# Patient Record
Sex: Male | Born: 1995 | Race: White | Hispanic: No | Marital: Single | Smoking: Never smoker
Health system: Southern US, Community
[De-identification: ages and names within clinical notes are randomized; demographics above are authoritative.]

## PROBLEM LIST (undated history)

## (undated) HISTORY — PX: OTHER SURGICAL HISTORY: SHX169

---
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
## 2017-02-16 ENCOUNTER — Encounter (HOSPITAL_COMMUNITY): Payer: Self-pay

## 2017-02-16 ENCOUNTER — Emergency Department (HOSPITAL_COMMUNITY): Payer: Worker's Compensation

## 2017-02-16 ENCOUNTER — Emergency Department (HOSPITAL_COMMUNITY)
Admission: EM | Admit: 2017-02-16 | Discharge: 2017-02-16 | Disposition: A | Discharge status: Home / Self Care | Payer: Worker's Compensation | Attending: Emergency Medicine | Admitting: Emergency Medicine

## 2017-02-16 DIAGNOSIS — Y929 Unspecified place or not applicable: Secondary | ICD-10-CM | POA: Diagnosis not present

## 2017-02-16 DIAGNOSIS — W01198A Fall on same level from slipping, tripping and stumbling with subsequent striking against other object, initial encounter: Secondary | ICD-10-CM | POA: Insufficient documentation

## 2017-02-16 DIAGNOSIS — Y990 Civilian activity done for income or pay: Secondary | ICD-10-CM | POA: Diagnosis not present

## 2017-02-16 DIAGNOSIS — Y9301 Activity, walking, marching and hiking: Secondary | ICD-10-CM | POA: Insufficient documentation

## 2017-02-16 DIAGNOSIS — S43014A Anterior dislocation of right humerus, initial encounter: Secondary | ICD-10-CM | POA: Diagnosis not present

## 2017-02-16 DIAGNOSIS — S4991XA Unspecified injury of right shoulder and upper arm, initial encounter: Secondary | ICD-10-CM | POA: Diagnosis present

## 2017-02-16 MED ORDER — PROPOFOL 10 MG/ML IV BOLUS
100.0000 mg | Freq: Once | INTRAVENOUS | Status: DC
  Filled 2017-02-16: qty 20

## 2017-02-16 MED ORDER — HYDROMORPHONE HCL 1 MG/ML IJ SOLN
1.0000 mg | Freq: Once | INTRAMUSCULAR | Status: AC
  Administered 2017-02-16: 1 mg via INTRAVENOUS
  Filled 2017-02-16: qty 1

## 2017-02-16 MED ORDER — IBUPROFEN 600 MG PO TABS: 600.0000 mg | ORAL_TABLET | Freq: Four times a day (QID) | ORAL | 0 refills | Status: AC | PRN

## 2017-02-16 MED ORDER — PROPOFOL 10 MG/ML IV BOLUS
INTRAVENOUS | Status: AC | PRN
  Administered 2017-02-16: 20 mg via INTRAVENOUS
  Administered 2017-02-16: 30 mg via INTRAVENOUS
  Administered 2017-02-16 (×2): 20 mg via INTRAVENOUS
  Administered 2017-02-16: 40 mg via INTRAVENOUS

## 2017-02-16 NOTE — Progress Notes (Signed)
Orthopedic Tech Progress Note Patient Details:  Brandon Zuniga January 18, 1996 [id number]  Ortho Devices Type of Ortho Device: Sling immobilizer Ortho Device/Splint Location: rue Ortho Device/Splint Interventions: Ordered, Application, Adjustment   Trinna PostMartinez, Claudio Mondry J 02/16/2017, 11:24 PM

## 2017-02-16 NOTE — ED Triage Notes (Signed)
Pt reports she slipped and fell today at work. He reports right shoulder pain, possible dislocation. + sensation, + radial pulse

## 2017-02-16 NOTE — ED Provider Notes (Signed)
[facility]-EMERGENCY DEPT Provider Note   CSN: [id number] Arrival date & time: 02/16/17  1810     History   Chief Complaint Chief Complaint  Patient presents with  . Shoulder Pain    HPI Brandon Zuniga is a 21 y.o. male.  The history is provided by the patient.  Shoulder Pain   This is a new problem. The current episode started 1 to 2 hours ago. The problem occurs constantly. The problem has not changed since onset.The pain is present in the right shoulder. The quality of the pain is described as aching. The pain is severe. Associated symptoms include limited range of motion. Pertinent negatives include no numbness. He has tried nothing for the symptoms. There has been a history of trauma (Slipped and fell on well at work falling onto the posterior aspect of his right shoulder.).    History reviewed. No pertinent past medical history.  There are no active problems to display for this patient.   History reviewed. No pertinent surgical history.     Home Medications    Prior to Admission medications   Medication Sig Start Date End Date Taking? Authorizing Provider  ibuprofen (ADVIL,MOTRIN) 600 MG tablet Take 1 tablet (600 mg total) by mouth every 6 (six) hours as needed. 02/16/17   Nira Connardama, Brennden Masten Eduardo, MD    Family History No family history on file.  Social History Social History  Substance Use Topics  . Smoking status: Never Smoker  . Smokeless tobacco: Never Used  . Alcohol use No     Allergies   Patient has no allergy information on record.   Review of Systems Review of Systems  Neurological: Negative for numbness.   All other systems are reviewed and are negative for acute change except as noted in the HPI   Physical Exam Updated Vital Signs BP 126/67   Pulse (!) 59   Temp 98.9 F (37.2 C) (Oral)   Resp 19   Ht 6' (1.829 m)   Wt 81.6 kg (180 lb)   SpO2 100%   BMI 24.41 kg/m   Physical Exam  Constitutional: He is oriented to person, place,  and time. He appears well-developed and well-nourished. No distress.  HENT:  Head: Normocephalic.  Right Ear: External ear normal.  Left Ear: External ear normal.  Mouth/Throat: Oropharynx is clear and moist.  Eyes: Conjunctivae and EOM are normal. Pupils are equal, round, and reactive to light. Right eye exhibits no discharge. Left eye exhibits no discharge. No scleral icterus.  Neck: Normal range of motion. Neck supple.  Cardiovascular: Regular rhythm and normal heart sounds.  Exam reveals no gallop and no friction rub.   No murmur heard. Pulses:      Radial pulses are 2+ on the right side, and 2+ on the left side.       Dorsalis pedis pulses are 2+ on the right side, and 2+ on the left side.  Pulmonary/Chest: Effort normal and breath sounds normal. No stridor. No respiratory distress.  Abdominal: Soft. He exhibits no distension. There is no tenderness.  Musculoskeletal:       Right shoulder: He exhibits tenderness and deformity. He exhibits normal pulse and normal strength.       Cervical back: He exhibits no bony tenderness.       Thoracic back: He exhibits no bony tenderness.       Lumbar back: He exhibits no bony tenderness.  Clavicle stable. Chest stable to AP/Lat compression. Pelvis stable to Lat compression. No  obvious extremity deformity. No chest or abdominal wall contusion.  Neurological: He is alert and oriented to person, place, and time. GCS eye subscore is 4. GCS verbal subscore is 5. GCS motor subscore is 6.  Moving all extremities   Skin: Skin is warm. He is not diaphoretic.     ED Treatments / Results  Labs (all labs ordered are listed, but only abnormal results are displayed) Labs Reviewed - No data to display  EKG  EKG Interpretation None       Radiology Dg Shoulder Right  Result Date: 02/16/2017 CLINICAL DATA:  Slipped and fell, with right shoulder pain. History of multiple dislocations. Initial encounter. EXAM: RIGHT SHOULDER - 2+ VIEW COMPARISON:   None. FINDINGS: There is anterior-inferior dislocation of the right humeral head. No definite Hill-Sachs or osseous Bankart lesion is seen. The right acromioclavicular joint is grossly unremarkable in appearance. The visualized portions of the lungs are clear. IMPRESSION: Anterior-inferior dislocation of right humeral head. No definite Hill-Sachs or osseous Bankart lesion seen. Electronically Signed   By: Roanna Raider M.D.   On: 02/16/2017 19:11   Dg Shoulder Right Portable  Result Date: 02/16/2017 CLINICAL DATA:  Postreduction EXAM: PORTABLE RIGHT SHOULDER COMPARISON:  02/16/2017 FINDINGS: Anatomic position of the right shoulder is demonstrated postreduction. No acute fractures are demonstrated. Coracoclavicular and acromioclavicular spaces are maintained. No focal bone lesion or bone destruction. IMPRESSION: Anatomic position of the right shoulder postreduction. No acute fractures identified. Electronically Signed   By: Burman Nieves M.D.   On: 02/16/2017 22:50    Procedures ORTHOPEDIC INJURY TREATMENT Date/Time: 02/16/2017 9:30 PM Performed by: Nira Conn Authorized by: Nira Conn  Consent: Written consent obtained. Consent given by: patient Patient understanding: patient states understanding of the procedure being performed Patient consent: the patient's understanding of the procedure matches consent given Imaging studies: imaging studies available Patient identity confirmed: arm band Time out: Immediately prior to procedure a "time out" was called to verify the correct patient, procedure, equipment, support staff and site/side marked as required. Injury location: shoulder Location details: right shoulder Injury type: dislocation Dislocation type: anterior Hill-Sachs deformity: no Chronicity: new Pre-procedure neurovascular assessment: neurovascularly intact  Anesthesia: Local anesthesia used: no  Sedation: Patient sedated: yes Sedatives:  propofol Vitals: Vital signs were monitored during sedation. Manipulation performed: yes Reduction method: external rotation Reduction successful: yes X-ray confirmed reduction: yes Immobilization: sling Post-procedure neurovascular assessment: post-procedure neurovascularly intact Patient tolerance: Patient tolerated the procedure well with no immediate complications    (including critical care time)   Procedural sedation Performed by: Amadeo Garnet Kerem Gilmer Consent: Verbal consent obtained. Risks and benefits: risks, benefits and alternatives were discussed Required items: required blood products, implants, devices, and/or special equipment available Patient identity confirmed: arm band and provided demographic data Time out: Immediately prior to procedure a "time out" was called to verify the correct patient, procedure, equipment, support staff and site/side marked as required.  Sedation type: moderate (conscious) sedation NPO time confirmed and considedered  Sedatives: 130mg  propofol   Physician Time at Bedside: 15  Vitals: Vital signs were monitored during sedation. Cardiac Monitor, pulse oximeter Patient tolerance: Patient tolerated the procedure well with no immediate complications. Comments: Pt with uneventful recovered. Returned to pre-procedural sedation baseline   Medications Ordered in ED Medications  propofol (DIPRIVAN) 10 mg/mL bolus/IV push 100 mg (100 mg Intravenous Canceled Entry 02/16/17 2150)  HYDROmorphone (DILAUDID) injection 1 mg (1 mg Intravenous Given 02/16/17 2032)  propofol (DIPRIVAN) 10 mg/mL bolus/IV push (30 mg  Intravenous Given 02/16/17 2210)     Initial Impression / Assessment and Plan / ED Course  I have reviewed the triage vital signs and the nursing notes.  Pertinent labs & imaging results that were available during my care of the patient were reviewed by me and considered in my medical decision making (see chart for details).     Right  anterior shoulder dislocation without evidence of acute fracture. Successful reduction with conscious sedation that was confirmed by repeat imaging. Patient provided with a sling. Instructed to follow-up with a primary care provider and his orthopedic as needed.  Final Clinical Impressions(s) / ED Diagnoses   Final diagnoses:  Anterior dislocation of right shoulder, initial encounter   Disposition: Discharge  Condition: Good  I have discussed the results, Dx and Tx plan with the patient who expressed understanding and agree(s) with the plan. Discharge instructions discussed at great length. The patient was given strict return precautions who verbalized understanding of the instructions. No further questions at time of discharge.    Discharge Medication List as of 02/16/2017 11:08 PM    START taking these medications   Details  ibuprofen (ADVIL,MOTRIN) 600 MG tablet Take 1 tablet (600 mg total) by mouth every 6 (six) hours as needed., Starting Fri 02/16/2017, Print        Follow Up: Primary care provider  Schedule an appointment as soon as possible for a visit  As needed      Mirjana Tarleton, Amadeo Garnet, MD 02/16/17 2347

## 2017-02-16 NOTE — Sedation Documentation (Signed)
Pt using urinal at bedside.

## 2018-10-31 ENCOUNTER — Emergency Department (HOSPITAL_COMMUNITY)
Admission: EM | Admit: 2018-10-31 | Discharge: 2018-10-31 | Disposition: A | Discharge status: Home / Self Care | Payer: No Typology Code available for payment source | Attending: Emergency Medicine | Admitting: Emergency Medicine

## 2018-10-31 ENCOUNTER — Emergency Department (HOSPITAL_COMMUNITY): Payer: No Typology Code available for payment source

## 2018-10-31 ENCOUNTER — Other Ambulatory Visit: Payer: Self-pay

## 2018-10-31 ENCOUNTER — Encounter (HOSPITAL_COMMUNITY): Payer: Self-pay

## 2018-10-31 DIAGNOSIS — Y929 Unspecified place or not applicable: Secondary | ICD-10-CM | POA: Diagnosis not present

## 2018-10-31 DIAGNOSIS — S43015A Anterior dislocation of left humerus, initial encounter: Secondary | ICD-10-CM | POA: Insufficient documentation

## 2018-10-31 DIAGNOSIS — S4992XA Unspecified injury of left shoulder and upper arm, initial encounter: Secondary | ICD-10-CM | POA: Diagnosis present

## 2018-10-31 DIAGNOSIS — Y999 Unspecified external cause status: Secondary | ICD-10-CM | POA: Insufficient documentation

## 2018-10-31 DIAGNOSIS — X509XXA Other and unspecified overexertion or strenuous movements or postures, initial encounter: Secondary | ICD-10-CM | POA: Insufficient documentation

## 2018-10-31 DIAGNOSIS — Y9389 Activity, other specified: Secondary | ICD-10-CM | POA: Diagnosis not present

## 2018-10-31 MED ORDER — KETAMINE HCL 50 MG/5ML IJ SOSY
60.0000 mg | PREFILLED_SYRINGE | Freq: Once | INTRAMUSCULAR | Status: AC
  Administered 2018-10-31: 60 mg via INTRAVENOUS
  Filled 2018-10-31: qty 10

## 2018-10-31 MED ORDER — PROPOFOL 10 MG/ML IV BOLUS
40.0000 mg | Freq: Once | INTRAVENOUS | Status: AC
  Administered 2018-10-31: 40 mg via INTRAVENOUS
  Filled 2018-10-31: qty 20

## 2018-10-31 MED ORDER — ONDANSETRON HCL 4 MG/2ML IJ SOLN
4.0000 mg | Freq: Once | INTRAMUSCULAR | Status: AC
  Administered 2018-10-31: 4 mg via INTRAVENOUS
  Filled 2018-10-31: qty 2

## 2018-10-31 MED ORDER — SODIUM CHLORIDE 0.9 % IV SOLN
INTRAVENOUS | Status: DC
  Administered 2018-10-31: 09:00:00 via INTRAVENOUS

## 2018-10-31 MED ORDER — FENTANYL CITRATE (PF) 100 MCG/2ML IJ SOLN
75.0000 ug | Freq: Once | INTRAMUSCULAR | Status: AC
  Administered 2018-10-31: 75 ug via INTRAVENOUS
  Filled 2018-10-31: qty 2

## 2018-10-31 NOTE — ED Triage Notes (Signed)
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  Pt states hx of L shoulder surgery and dislocation x3.

## 2018-10-31 NOTE — Discharge Instructions (Signed)
Take 400 to 600 mg of ibuprofen every 6 hours as needed for pain.  Wear shoulder immobilizer at all times aside from bathing today follow-up with orthopedics.  This includes sleeping.    Would recommend following up with orthopedic surgeon who has seen you previously but the contact information for the on-call orthopedist is included with your paperwork if needed.

## 2018-10-31 NOTE — ED Provider Notes (Signed)
[facility]-EMERGENCY DEPT Provider Note   CSN: [id number] Arrival date & time: 10/31/18  8882     History   Chief Complaint No chief complaint on file.   HPI Brandon Zuniga is a 23 y.o. male.  HPI   23 year old male with left shoulder pain.  He thinks he dislocated.  He has a past history of the same multiple times with prior surgery.  Prior to arrival today he was working to align a car when he had a sudden onset of pain in his left shoulder.  Deformity.  Severe pain with attempted range of motion.  No numbness or tingling.  Denies any other injuries.  No past medical history on file.  There are no active problems to display for this patient.   Past Surgical History:  Procedure Laterality Date  . L shoulder surgery          Home Medications    Prior to Admission medications   Medication Sig Start Date End Date Taking? Authorizing Provider  ibuprofen (ADVIL,MOTRIN) 600 MG tablet Take 1 tablet (600 mg total) by mouth every 6 (six) hours as needed. Patient not taking: Reported on 10/31/2018 02/16/17   Nira Conn, MD    Family History No family history on file.  Social History Social History   Tobacco Use  . Smoking status: Never Smoker  . Smokeless tobacco: Never Used  Substance Use Topics  . Alcohol use: No  . Drug use: Not on file     Allergies   Patient has no known allergies.   Review of Systems Review of Systems  All systems reviewed and negative, other than as noted in HPI.  Physical Exam Updated Vital Signs BP (!) 142/82   Pulse 92   Temp 98.3 F (36.8 C)   Resp 16   Ht 6' (1.829 m)   Wt 77.1 kg   SpO2 98%   BMI 23.06 kg/m   Physical Exam Vitals signs and nursing note reviewed.  Constitutional:      General: He is not in acute distress.    Appearance: He is well-developed.  HENT:     Head: Normocephalic and atraumatic.  Eyes:     General:        Right eye: No discharge.        Left eye: No  discharge.     Conjunctiva/sclera: Conjunctivae normal.  Neck:     Musculoskeletal: Neck supple.  Cardiovascular:     Rate and Rhythm: Normal rate and regular rhythm.     Heart sounds: Normal heart sounds. No murmur. No friction rub. No gallop.   Pulmonary:     Effort: Pulmonary effort is normal. No respiratory distress.     Breath sounds: Normal breath sounds.  Abdominal:     General: There is no distension.     Palpations: Abdomen is soft.     Tenderness: There is no abdominal tenderness.  Musculoskeletal:        General: Tenderness and deformity present.     Comments: Deformity to left shoulder consistent with an anterior dislocation.  No deltoid anesthesia.  Small well-healed surgical scars noted at the left shoulder.  He cannot range the shoulder secondary to severe pain.  Appears to be neurovascular intact distally.  Skin:    General: Skin is warm and dry.  Neurological:     Mental Status: He is alert.  Psychiatric:        Behavior: Behavior normal.  Thought Content: Thought content normal.      ED Treatments / Results  Labs (all labs ordered are listed, but only abnormal results are displayed) Labs Reviewed - No data to display  EKG None  Radiology No results found.   Dg Shoulder Left  Result Date: 10/31/2018 CLINICAL DATA:  pulling injury at work this am, hx. Dislocation x 3 and surgery, c/o pain entire shoulder EXAM: LEFT SHOULDER - 2+ VIEW COMPARISON:  Left shoulder radiographs, 05/22/2015. Left shoulder MRI, 07/20/2015. FINDINGS: Anterior inferior dislocation of the humeral head in relation to the glenoid. There are lucencies along the anterior to inferior margin of the glenoid consistent with prior labral surgery. No fracture.  No bone lesion. AC joint is normally spaced and aligned. IMPRESSION: 1. Anterior inferior dislocation of the left humeral head. No fracture. Electronically Signed   By: Amie Portlandavid  Ormond M.D.   On: 10/31/2018 08:59   Dg Shoulder Left  Portable  Result Date: 10/31/2018 CLINICAL DATA:  Post reduction images of the left shoulder. EXAM: LEFT SHOULDER - 1 VIEW COMPARISON:  Pre reduction images, 10/31/2018 at 8:31 a.m. FINDINGS: The humeral head has been successfully reduced into alignment with the glenoid. No fracture. IMPRESSION: Successful reduction of the anterior inferior left shoulder dislocation. Electronically Signed   By: Amie Portlandavid  Ormond M.D.   On: 10/31/2018 10:14   Procedures .Sedation Date/Time: 10/31/2018 9:30 AM Performed by: Raeford RazorKohut, Masaki Rothbauer, MD Authorized by: Raeford RazorKohut, Yisell Sprunger, MD   Consent:    Consent obtained:  Verbal and written   Consent given by:  Patient   Risks discussed:  Allergic reaction, dysrhythmia, inadequate sedation, nausea, prolonged hypoxia resulting in organ damage, prolonged sedation necessitating reversal, respiratory compromise necessitating ventilatory assistance and intubation and vomiting   Alternatives discussed:  Analgesia without sedation, anxiolysis and regional anesthesia Universal protocol:    Procedure explained and questions answered to patient or proxy's satisfaction: yes     Relevant documents present and verified: yes     Test results available and properly labeled: yes     Imaging studies available: yes     Required blood products, implants, devices, and special equipment available: yes     Site/side marked: yes     Immediately prior to procedure a time out was called: yes     Patient identity confirmation method:  Verbally with patient Indications:    Procedure necessitating sedation performed by:  Physician performing sedation Pre-sedation assessment:    Time since last food or drink:  .   ASA classification: class 1 - normal, healthy patient     Neck mobility: normal     Mouth opening:  3 or more finger widths   Thyromental distance:  4 finger widths   Mallampati score:  I - soft palate, uvula, fauces, pillars visible   Pre-sedation assessments completed and reviewed: airway  patency, cardiovascular function, hydration status, mental status, nausea/vomiting, pain level, respiratory function and temperature   Immediate pre-procedure details:    Reassessment: Patient reassessed immediately prior to procedure     Reviewed: vital signs, relevant labs/tests and NPO status     Verified: bag valve mask available, emergency equipment available, intubation equipment available, IV patency confirmed, oxygen available and suction available   Procedure details (see MAR for exact dosages):    Preoxygenation:  Nasal cannula   Sedation:  Propofol   Intra-procedure monitoring:  Blood pressure monitoring, cardiac monitor, continuous pulse oximetry, frequent LOC assessments, frequent vital sign checks and continuous capnometry   Intra-procedure events: none  Total Provider sedation time (minutes):  30 Post-procedure details:    Attendance: Constant attendance by certified staff until patient recovered     Recovery: Patient returned to pre-procedure baseline     Post-sedation assessments completed and reviewed: airway patency, cardiovascular function, hydration status, mental status, nausea/vomiting, pain level, respiratory function and temperature     Patient is stable for discharge or admission: yes     Patient tolerance:  Tolerated well, no immediate complications   (including critical care time)  Reduction of dislocation Date/Time: 12:58 PM Performed by: Raeford Razor Authorized by: Raeford Razor Consent: Verbal consent obtained. Risks and benefits: risks, benefits and alternatives were discussed Consent given by: patient Required items: required blood products, implants, devices, and special equipment available Time out: Immediately prior to procedure a "time out" was called to verify the correct patient, procedure, equipment, support staff and site/side marked as required.  Patient sedated: yes, see MAR  Vitals: Vital signs were monitored during sedation. Patient  tolerance: Patient tolerated the procedure well with no immediate complications. Joint: L shoulder Reduction technique: traction    Medications Ordered in ED Medications  propofol (DIPRIVAN) 10 mg/mL bolus/IV push 40 mg (has no administration in time range)  0.9 %  sodium chloride infusion (has no administration in time range)  fentaNYL (SUBLIMAZE) injection 75 mcg (has no administration in time range)  ondansetron (ZOFRAN) injection 4 mg (has no administration in time range)  ketamine 50 mg in normal saline 5 mL (10 mg/mL) syringe (has no administration in time range)     Initial Impression / Assessment and Plan / ED Course  I have reviewed the triage vital signs and the nursing notes.  Pertinent labs & imaging results that were available during my care of the patient were reviewed by me and considered in my medical decision making (see chart for details).    I have reviewed the triage vital signs and the nursing notes. Prior records were reviewed for additional information.    Pertinent labs & imaging results that were available during my care of the patient were reviewed by me and considered in my medical decision making (see chart for details).  23 year old male with what is clinically a left anterior shoulder dislocation.  Closed injury.  Neurovascular intact.  Plain imaging, sedation, reduction and discharged with orthopedic follow-up.  Reduced. Remains NVI. Ortho FU.  It has been determined that no acute conditions requiring further emergency intervention are present at this time. The patient has been advised of the diagnosis and plan. I reviewed any labs and imaging including any potential incidental findings. I have reviewed nursing notes and appropriate previous records. We have discussed signs and symptoms that warrant return to the ED and they are listed in the discharge instructions.      Final Clinical Impressions(s) / ED Diagnoses   Final diagnoses:  Anterior  shoulder dislocation, left, initial encounter    ED Discharge Orders    None       Raeford Razor, MD 11/06/18 1300

## 2019-11-02 ENCOUNTER — Emergency Department (HOSPITAL_COMMUNITY): Payer: Self-pay

## 2019-11-02 ENCOUNTER — Emergency Department (HOSPITAL_COMMUNITY)
Admission: EM | Admit: 2019-11-02 | Discharge: 2019-11-02 | Disposition: A | Discharge status: Home / Self Care | Payer: Self-pay | Attending: Emergency Medicine | Admitting: Emergency Medicine

## 2019-11-02 ENCOUNTER — Encounter (HOSPITAL_COMMUNITY): Payer: Self-pay

## 2019-11-02 ENCOUNTER — Other Ambulatory Visit: Payer: Self-pay

## 2019-11-02 DIAGNOSIS — Y999 Unspecified external cause status: Secondary | ICD-10-CM | POA: Insufficient documentation

## 2019-11-02 DIAGNOSIS — X500XXA Overexertion from strenuous movement or load, initial encounter: Secondary | ICD-10-CM | POA: Insufficient documentation

## 2019-11-02 DIAGNOSIS — S43014A Anterior dislocation of right humerus, initial encounter: Secondary | ICD-10-CM | POA: Insufficient documentation

## 2019-11-02 DIAGNOSIS — Y92003 Bedroom of unspecified non-institutional (private) residence as the place of occurrence of the external cause: Secondary | ICD-10-CM | POA: Insufficient documentation

## 2019-11-02 DIAGNOSIS — Y9383 Activity, rough housing and horseplay: Secondary | ICD-10-CM | POA: Insufficient documentation

## 2019-11-02 MED ORDER — HYDROMORPHONE HCL 1 MG/ML IJ SOLN
1.0000 mg | Freq: Once | INTRAMUSCULAR | Status: DC
  Administered 2019-11-02: 19:00:00 1 mg via INTRAMUSCULAR
  Filled 2019-11-02: qty 1

## 2019-11-02 MED ORDER — PROPOFOL 10 MG/ML IV BOLUS
0.5000 mg/kg | Freq: Once | INTRAVENOUS | Status: AC
  Administered 2019-11-02: 180 mg via INTRAVENOUS
  Filled 2019-11-02: qty 20

## 2019-11-02 MED ORDER — FENTANYL CITRATE (PF) 100 MCG/2ML IJ SOLN
50.0000 ug | Freq: Once | INTRAMUSCULAR | Status: DC
  Filled 2019-11-02: qty 2

## 2019-11-02 NOTE — ED Provider Notes (Signed)
[facility] DEPT Provider Note   CSN: [id number] Arrival date & time: 11/02/19  1800     History Chief Complaint  Patient presents with  . Shoulder Injury    Brandon Zuniga is a 24 y.o. male with history of recurrent shoulder dislocations presents for evaluation of acute onset, persistent right shoulder pain beginning around 40 minutes prior to arrival.  He reports that he was "play fighting" with his girlfriend in bed when he felt his right shoulder "pop" and developed immediate onset of severe right shoulder pressure.  Pain radiates down the right upper extremity.  He denies numbness or weakness of the right upper extremity. Severe pain with movement.  No specific injury or loss of consciousness.  No medications prior to arrival.  He had surgery performed in [facility] on the left shoulder and has not had dislocation on that side since then.  The history is provided by the patient.       History reviewed. No pertinent past medical history.  There are no problems to display for this patient.   Past Surgical History:  Procedure Laterality Date  . L shoulder surgery         History reviewed. No pertinent family history.  Social History   Tobacco Use  . Smoking status: Never Smoker  . Smokeless tobacco: Never Used  Substance Use Topics  . Alcohol use: No  . Drug use: Not on file    Home Medications Prior to Admission medications   Medication Sig Start Date End Date Taking? Authorizing Provider  ibuprofen (ADVIL,MOTRIN) 200 MG tablet Take 400-600 mg by mouth every 6 (six) hours as needed for headache or mild pain.    [provider]  ibuprofen (ADVIL,MOTRIN) 600 MG tablet Take 1 tablet (600 mg total) by mouth every 6 (six) hours as needed. Patient not taking: Reported on 10/31/2018 02/16/17   Fatima Blank, MD    Allergies    Patient has no known allergies.  Review of Systems   Review of Systems    Constitutional: Negative for fever.  Musculoskeletal: Positive for arthralgias.  Neurological: Negative for numbness.    Physical Exam Updated Vital Signs BP 106/67   Pulse 61   Temp 98.7 F (37.1 C) (Oral)   Resp 16   Ht 6' (1.829 m)   Wt 81.6 kg   SpO2 100%   BMI 24.41 kg/m   Physical Exam Vitals and nursing note reviewed.  Constitutional:      General: He is not in acute distress.    Appearance: He is well-developed.     Comments: Appears uncomfortable  HENT:     Head: Normocephalic and atraumatic.  Eyes:     General:        Right eye: No discharge.        Left eye: No discharge.     Conjunctiva/sclera: Conjunctivae normal.  Neck:     Vascular: No JVD.     Trachea: No tracheal deviation.     Comments: No midline cervical spine tenderness, no deformity, crepitus, or step-off noted Cardiovascular:     Rate and Rhythm: Tachycardia present.     Pulses: Normal pulses.     Comments: 2+ radial pulses bilaterally Pulmonary:     Effort: Pulmonary effort is normal.  Abdominal:     General: There is no distension.  Musculoskeletal:     Cervical back: Neck supple.     Comments: Right upper extremity in shoulder sling.  Severely limited range of motion at the right shoulder.  Pain at the shoulder with movement at the elbow.  Good grip strength bilaterally.  Skin:    General: Skin is warm and dry.     Capillary Refill: Capillary refill takes less than 2 seconds.     Findings: No erythema.  Neurological:     Mental Status: He is alert.     Comments: Sensation intact to light touch of bilateral upper extremities  Psychiatric:        Behavior: Behavior normal.     ED Results / Procedures / Treatments   Labs (all labs ordered are listed, but only abnormal results are displayed) Labs Reviewed - No data to display  EKG None  Radiology DG Shoulder Right  Result Date: 11/02/2019 CLINICAL DATA:  Dislocation EXAM: RIGHT SHOULDER - 2+ VIEW COMPARISON:  02/16/2017  FINDINGS: There is anterior dislocation of the right shoulder. No fracture visualized. IMPRESSION: Anterior right shoulder dislocation. Electronically Signed   By: Charlett Nose M.D.   On: 11/02/2019 18:39   DG Shoulder Right Portable  Result Date: 11/02/2019 CLINICAL DATA:  Reduction of ANTERIOR glenohumeral dislocation. EXAM: PORTABLE RIGHT SHOULDER 7:39 p.m.: COMPARISON:  RIGHT shoulder x-rays earlier same day at 6:26 p.m. FINDINGS: Anatomic alignment of the glenohumeral joint post reduction. No visible fracture. IMPRESSION: Anatomic alignment of the glenohumeral joint post reduction. Electronically Signed   By: Hulan Saas M.D.   On: 11/02/2019 19:57    Procedures Procedures (including critical care time)  Medications Ordered in ED Medications  propofol (DIPRIVAN) 10 mg/mL bolus/IV push 40.8 mg (180 mg Intravenous Given 11/02/19 1930)    ED Course  I have reviewed the triage vital signs and the nursing notes.  Pertinent labs & imaging results that were available during my care of the patient were reviewed by me and considered in my medical decision making (see chart for details).    MDM Rules/Calculators/A&P                      Patient presents for evaluation after likely right shoulder dislocation.  Has a history of frequent shoulder dislocations bilaterally, and has been seen by an orthopedist in [facility] who performed surgery on the left shoulder a couple of years ago.  Patient is afebrile, initially tachycardic but appears to be quite uncomfortable.  He is neurovascularly intact.  Compartments are soft.  Radiographs confirm anterior right shoulder dislocation.  Patient was consciously sedated by Dr. Juleen China and the shoulder was reduced without difficulty.  He was immediately placed in a sling and postreduction films confirm relocation of the joint.  No fractures noted on repeat films.  On reevaluation patient is resting comfortably in no apparent distress, reports he is  feeling much better.  He is in the shoulder immobilizer.  He will follow up with his orthopedist or our orthopedist on-call for reevaluation on an outpatient basis.  Discussed strict ED return precautions.  Patient verbalized understanding of and agreement with plan and is safe for discharge home at this time.  Patient seen and evaluated by Dr. Juleen China who agrees with assessment and plan at this time.  Final Clinical Impression(s) / ED Diagnoses Final diagnoses:  Anterior dislocation of right shoulder, initial encounter    Rx / DC Orders ED Discharge Orders    None       Jeanie Sewer, PA-C 11/04/19 1407    Raeford Razor, MD 11/08/19 0710

## 2019-11-02 NOTE — Discharge Instructions (Signed)
1. Medications: Alternate 600 mg of ibuprofen and [phone] mg of Tylenol every 3 hours as needed for pain. Do not exceed 4000 mg of Tylenol daily.  Take ibuprofen with food to avoid upset stomach issues.  2. Treatment: rest, ice, wear the shoulder sling at all times until follow-up with orthopedics within 1 to 2 weeks. 3. Follow Up: Please followup with orthopedics within 1 to 2 weeks for reevaluation.  You can follow-up with your orthopedist in [place] or you can call the orthopedist I have attached information for on your paperwork today; Please return to the ER for worsening symptoms or other concerns such as worsening swelling, redness of the skin, fevers, loss of pulses, or loss of feeling

## 2019-11-02 NOTE — ED Notes (Signed)
Pt contact fiance updated on pt status

## 2019-11-02 NOTE — Progress Notes (Signed)
This writer was called to pt bedside for conscious sedation procedure.  Pt placed on 2lnc/etco2.  Pt tolerated procedure well without incident, vitals wnl.

## 2019-11-02 NOTE — ED Notes (Signed)
Consent at bedside.  

## 2019-11-02 NOTE — ED Triage Notes (Signed)
Pt presents with c/o possible right shoulder dislocation, hx of same. Pt reports he was laying in the bed and rolled over wrong, dislocating his shoulder.

## 2019-11-02 NOTE — ED Provider Notes (Signed)
Medical screening examination/treatment/procedure(s) were conducted as a shared visit with non-physician practitioner(s) and myself.  I personally evaluated the patient during the encounter.  23yM with anterior R shoulder dislocation. Sedated. Reduced. NVI. Ortho FU.    Marland KitchenSedation  Date/Time: 11/02/2019 8:01 PM Performed by: Raeford Razor, MD Authorized by: Raeford Razor, MD   Consent:    Consent obtained:  Verbal   Consent given by:  Patient   Risks discussed:  Allergic reaction, dysrhythmia, inadequate sedation, nausea, prolonged hypoxia resulting in organ damage, prolonged sedation necessitating reversal, respiratory compromise necessitating ventilatory assistance and intubation and vomiting   Alternatives discussed:  Analgesia without sedation, anxiolysis and regional anesthesia Universal protocol:    Procedure explained and questions answered to patient or proxy's satisfaction: yes     Relevant documents present and verified: yes     Test results available and properly labeled: yes     Imaging studies available: yes     Required blood products, implants, devices, and special equipment available: yes     Site/side marked: yes     Immediately prior to procedure a time out was called: yes     Patient identity confirmation method:  Verbally with patient Indications:    Procedure necessitating sedation performed by:  Physician performing sedation Pre-sedation assessment:    Time since last food or drink:  .   ASA classification: class 1 - normal, healthy patient     Neck mobility: normal     Mouth opening:  3 or more finger widths   Thyromental distance:  4 finger widths   Mallampati score:  I - soft palate, uvula, fauces, pillars visible   Pre-sedation assessments completed and reviewed: airway patency, cardiovascular function, hydration status, mental status, nausea/vomiting, pain level, respiratory function and temperature   Immediate pre-procedure details:    Reassessment:  Patient reassessed immediately prior to procedure     Reviewed: vital signs, relevant labs/tests and NPO status     Verified: bag valve mask available, emergency equipment available, intubation equipment available, IV patency confirmed, oxygen available and suction available   Procedure details (see MAR for exact dosages):    Preoxygenation:  Nasal cannula   Sedation:  Propofol   Intended level of sedation: deep   Intra-procedure monitoring:  Blood pressure monitoring, cardiac monitor, continuous pulse oximetry, frequent LOC assessments, frequent vital sign checks and continuous capnometry   Intra-procedure events: none     Total Provider sedation time (minutes):  30 Post-procedure details:    Attendance: Constant attendance by certified staff until patient recovered     Recovery: Patient returned to pre-procedure baseline     Post-sedation assessments completed and reviewed: airway patency, cardiovascular function, hydration status, mental status, nausea/vomiting, pain level, respiratory function and temperature     Patient is stable for discharge or admission: yes     Patient tolerance:  Tolerated well, no immediate complications Reduction of dislocation  Date/Time: 11/02/2019 7:30 PM Performed by: Raeford Razor, MD Authorized by: Raeford Razor, MD  Consent: Verbal consent obtained. Written consent obtained. Risks and benefits: risks, benefits and alternatives were discussed Time out: Immediately prior to procedure a "time out" was called to verify the correct patient, procedure, equipment, support staff and site/side marked as required. Local anesthesia used: no  Anesthesia: Local anesthesia used: no  Sedation: Patient sedated: yes Sedation type: moderate (conscious) sedation Sedatives: see MAR for details  Patient tolerance: patient tolerated the procedure well with no immediate complications Comments: Reduced and placed in sling/immobilizer.  Virgel Manifold,  MD 11/02/19 2003

## 2021-05-31 IMAGING — DX DG SHOULDER 2+V PORT*R*
2 series · 2 of 2 positions shown · non-contrast
Comparison: RIGHT shoulder x-rays earlier same day at [DATE] p.m.

CLINICAL DATA: Reduction of ANTERIOR glenohumeral dislocation.

EXAM:
PORTABLE RIGHT SHOULDER [DATE] p.m.:

[shoulder ap]
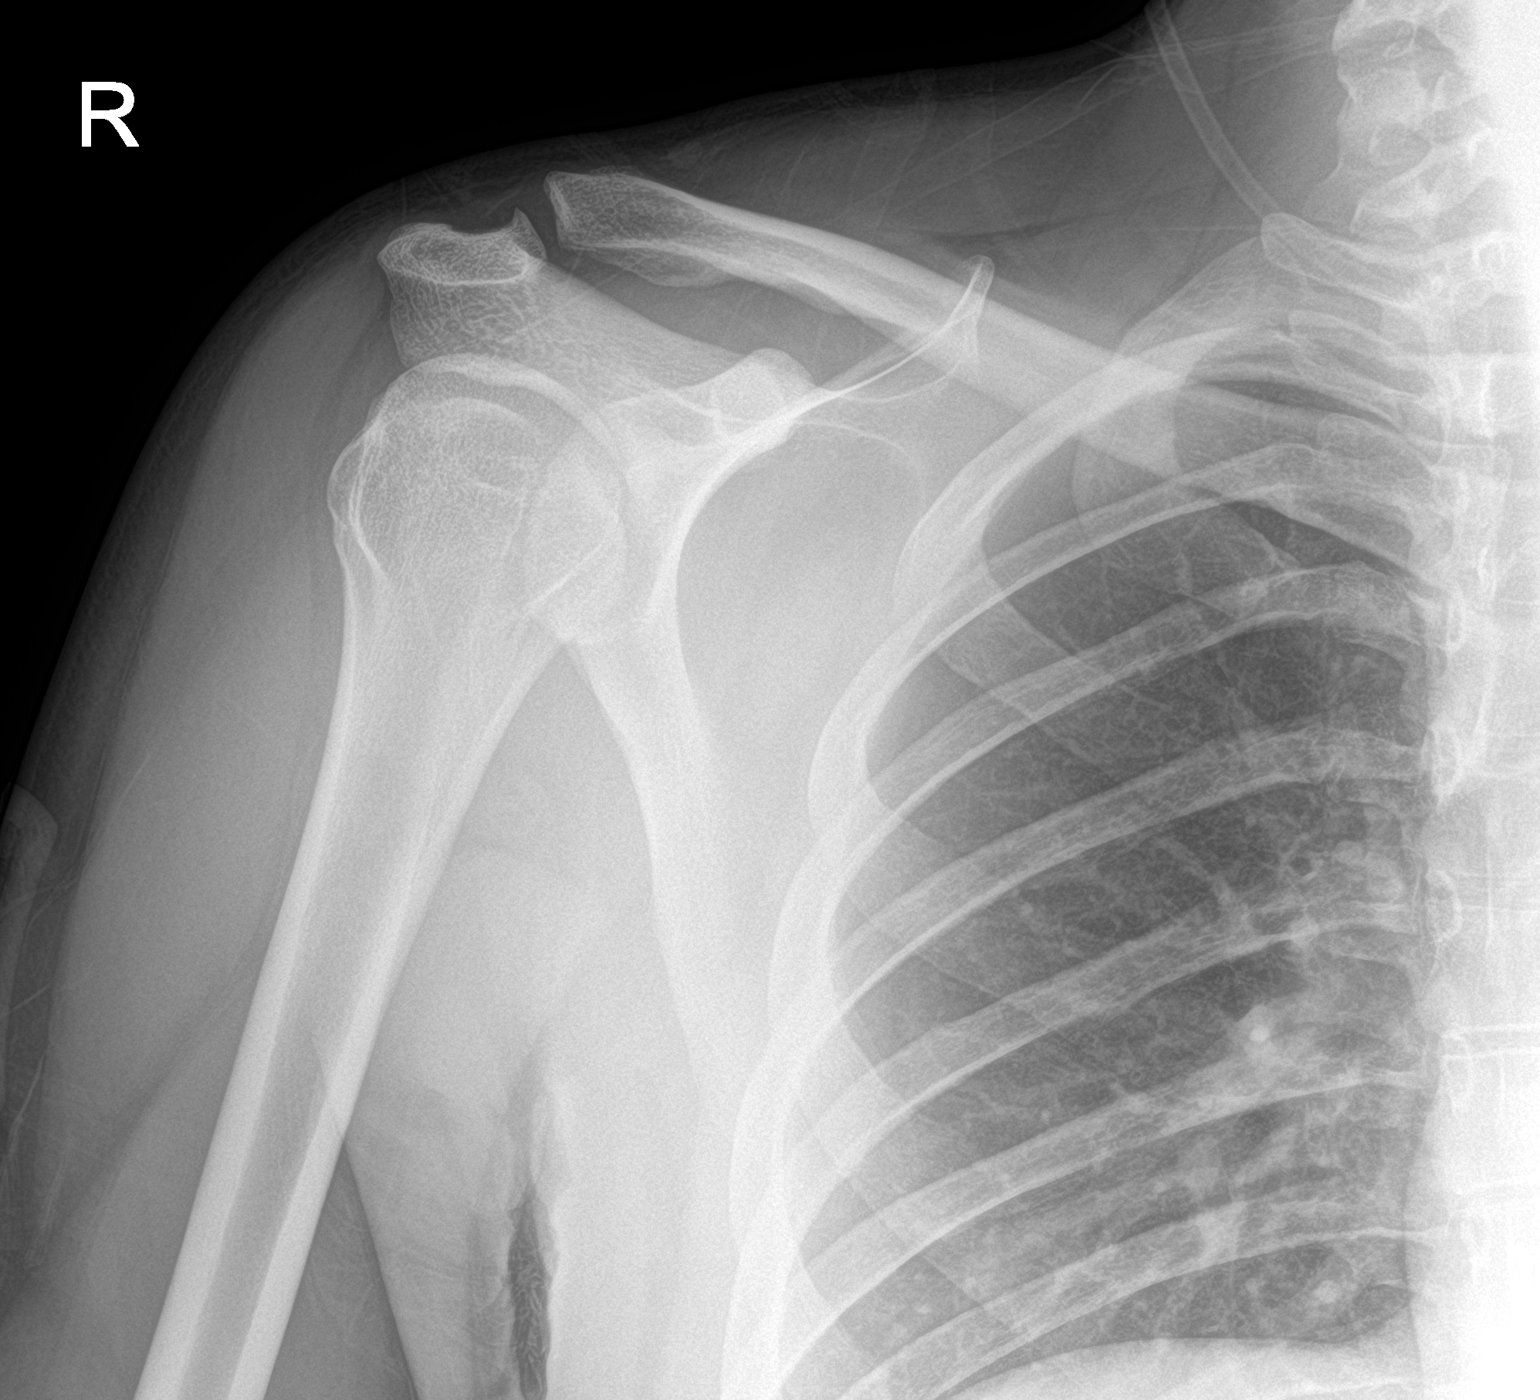

[shoulder obl]
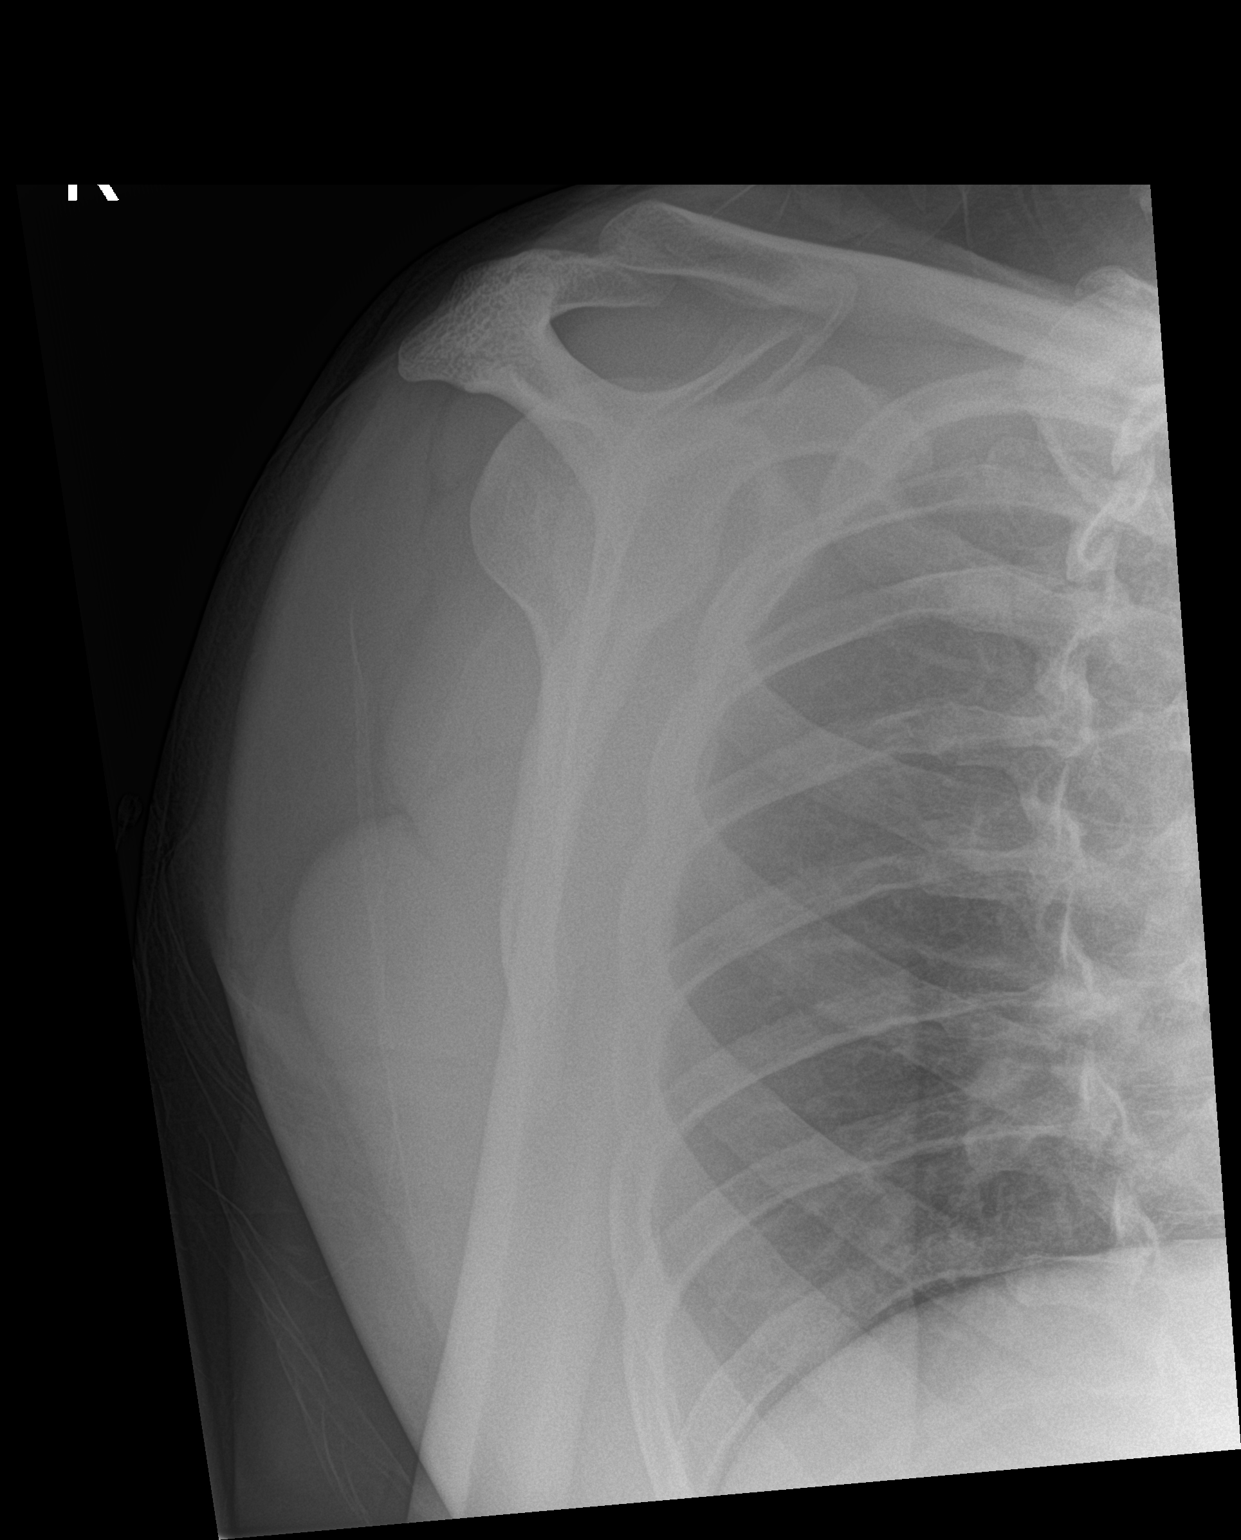

[2 of 2 positions shown; findings below may reference images not displayed]

FINDINGS: Anatomic alignment of the glenohumeral joint post reduction. No
visible fracture.
IMPRESSION: Anatomic alignment of the glenohumeral joint post reduction.

## 2022-05-30 ENCOUNTER — Emergency Department (HOSPITAL_COMMUNITY)
Admission: EM | Admit: 2022-05-30 | Discharge: 2022-05-31 | Disposition: A | Discharge status: Home / Self Care | Payer: Self-pay | Attending: Emergency Medicine | Admitting: Emergency Medicine

## 2022-05-30 ENCOUNTER — Emergency Department (HOSPITAL_COMMUNITY): Payer: Self-pay

## 2022-05-30 ENCOUNTER — Encounter (HOSPITAL_COMMUNITY): Payer: Self-pay

## 2022-05-30 ENCOUNTER — Other Ambulatory Visit: Payer: Self-pay

## 2022-05-30 DIAGNOSIS — S43014A Anterior dislocation of right humerus, initial encounter: Secondary | ICD-10-CM | POA: Insufficient documentation

## 2022-05-30 DIAGNOSIS — X509XXA Other and unspecified overexertion or strenuous movements or postures, initial encounter: Secondary | ICD-10-CM | POA: Insufficient documentation

## 2022-05-30 DIAGNOSIS — Y9389 Activity, other specified: Secondary | ICD-10-CM | POA: Insufficient documentation

## 2022-05-30 MED ORDER — PROPOFOL 10 MG/ML IV BOLUS
INTRAVENOUS | Status: AC
  Filled 2022-05-30: qty 20

## 2022-05-30 MED ORDER — PROPOFOL 10 MG/ML IV BOLUS
1.0000 mg/kg | Freq: Once | INTRAVENOUS | Status: DC | PRN
  Filled 2022-05-30: qty 20

## 2022-05-30 MED ORDER — PROPOFOL 500 MG/50ML IV EMUL
INTRAVENOUS | Status: AC
  Filled 2022-05-30: qty 50

## 2022-05-30 MED ORDER — FENTANYL CITRATE PF 50 MCG/ML IJ SOSY
50.0000 ug | PREFILLED_SYRINGE | Freq: Once | INTRAMUSCULAR | Status: AC
  Administered 2022-05-30: 50 ug via INTRAVENOUS
  Filled 2022-05-30: qty 1

## 2022-05-30 NOTE — Progress Notes (Signed)
Called to pt bedside for conscious sedation procedure.  Pt tolerated procedure well without incident.

## 2022-05-30 NOTE — ED Provider Triage Note (Signed)
Emergency Medicine Provider Triage Evaluation Note  Brandon Zuniga , a 26 y.o. male  was evaluated in triage.  Pt complains of right shoulder dislocation. Picked up dog and heard a pop. Previous dislocation 12 times.   Review of Systems  Positive: Shoulder pain Negative: headache  Physical Exam  BP (!) 188/105 (BP Location: Left Wrist)   Pulse (!) 102   Temp 98.9 F (37.2 C) (Oral)   Resp 19   Ht 6' (1.829 m)   Wt 83.9 kg   SpO2 98%   BMI 25.09 kg/m  Gen:   Awake, no distress   Resp:  Normal effort  MSK:   Moves extremities without difficulty  Other:  Deformity to right shoulder  Medical Decision Making  Medically screening exam initiated at 9:57 PM.  Appropriate orders placed.  Brandon Zuniga was informed that the remainder of the evaluation will be completed by another provider, this initial triage assessment does not replace that evaluation, and the importance of remaining in the ED until their evaluation is complete.  Portable x-ray   Brandon Zuniga 05/30/22 2158

## 2022-05-30 NOTE — Discharge Instructions (Addendum)
We evaluated you in the emergency department for shoulder pain.  Your evaluation was notable for a right shoulder dislocation.  We gave you sedation and relocated your shoulder.  Please wear the sling at all times until you follow-up with orthopedic surgery to prevent recurrent dislocation.  Please take Tylenol and Motrin for your symptoms at home.  You can take 650 mg of Tylenol every 6 hours and 600 mg of ibuprofen every 6 hours as needed for your symptoms.  You can take these medicines together as needed, either at the same time, or alternating every 3 hours.  Return to the emergency department if you develop any recurrent symptoms such as recurrent dislocation or pain.

## 2022-05-30 NOTE — ED Notes (Signed)
2305 Time-out  2306 prop 90 mg 2307 prop 20mg  2310 prop 40mg  2312 prop 40mg  2313 prop 40mg  2315 shoulder in an immobilizer placed on pt

## 2022-05-30 NOTE — ED Triage Notes (Signed)
Pt reports right shoulder pain and decreased ROM after picking up dog and hearing a pop. Pt reports shoulder dislocation x12.

## 2022-05-31 NOTE — ED Provider Notes (Signed)
[facility]-EMERGENCY DEPT Provider Note  CSN: [id number] Arrival date & time: 05/30/22 2117  Chief Complaint(s) Shoulder Pain  HPI Brandon Zuniga is a 26 y.o. male presenting to the emergency department with right shoulder pain.  He reports he was reaching for his dog when he felt a pop in his right shoulder.  He denies numbness or tingling.  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  He has had prior surgery.  Denies any other injuries, pain.  Reports the pain is severe, worse with movement.  This occurred today, just prior to arrival.   Past Medical History History reviewed. No pertinent past medical history. There are no problems to display for this patient.  Home Medication(s) Prior to Admission medications   Medication Sig Start Date End Date Taking? Authorizing Provider  ibuprofen (ADVIL,MOTRIN) 200 MG tablet Take 400-600 mg by mouth every 6 (six) hours as needed for headache or mild pain.    [provider]  ibuprofen (ADVIL,MOTRIN) 600 MG tablet Take 1 tablet (600 mg total) by mouth every 6 (six) hours as needed. Patient not taking: Reported on 10/31/2018 02/16/17   Nira Conn, MD                                                                                                                                    Past Surgical History Past Surgical History:  Procedure Laterality Date   L shoulder surgery     Family History History reviewed. No pertinent family history.  Social History Social History   Tobacco Use   Smoking status: Never   Smokeless tobacco: Never  Substance Use Topics   Alcohol use: No   Allergies Patient has no known allergies.  Review of Systems Review of Systems  All other systems reviewed and are negative.   Physical Exam Vital Signs  I have reviewed the triage vital signs BP 124/67   Pulse (!) 57   Temp 98.9 F (37.2 C) (Oral)   Resp (!) 28   Ht 6' (1.829 m)   Wt 83.9 kg    SpO2 95%   BMI 25.09 kg/m  Physical Exam Vitals and nursing note reviewed.  Constitutional:      General: He is not in acute distress.    Appearance: Normal appearance.  HENT:     Mouth/Throat:     Mouth: Mucous membranes are moist.  Eyes:     Conjunctiva/sclera: Conjunctivae normal.  Cardiovascular:     Rate and Rhythm: Normal rate and regular rhythm.  Pulmonary:     Effort: Pulmonary effort is normal. No respiratory distress.     Breath sounds: Normal breath sounds.  Abdominal:     General: Abdomen is flat.     Palpations: Abdomen is soft.     Tenderness: There is no abdominal tenderness.  Musculoskeletal:     Right lower leg: No edema.     Left  lower leg: No edema.     Comments: Holding right shoulder adducted and internally rotated.  Unable to range right shoulder at all due to pain.  No sensory deficit to light touch in axillary, median, ulnar, radial nerve distributions.  2+ distal radial pulses bilaterally.  Skin:    General: Skin is warm and dry.     Capillary Refill: Capillary refill takes less than 2 seconds.  Neurological:     Mental Status: He is alert and oriented to person, place, and time. Mental status is at baseline.  Psychiatric:        Mood and Affect: Mood normal.        Behavior: Behavior normal.     ED Results and Treatments Labs (all labs ordered are listed, but only abnormal results are displayed) Labs Reviewed - No data to display                                                                                                                        Radiology DG Shoulder Right  Result Date: 05/31/2022 CLINICAL DATA:  Status post reduction. EXAM: RIGHT SHOULDER - 2+ VIEW COMPARISON:  Pre reduction radiograph earlier today. FINDINGS: Improved alignment of previous anterior shoulder dislocation, although exam glenohumeral alignment is difficult to accurately assessed on provided views. There is a suspected Hill-Sachs impaction injury to the lateral  humeral head. No evidence of bony Bankart. IMPRESSION: Improved alignment of previous anterior shoulder dislocation. Suspected Hill-Sachs impaction injury to the lateral humeral head. Electronically Signed   By: Narda Rutherford M.D.   On: 05/31/2022 00:02   DG Shoulder Right  Result Date: 05/30/2022 CLINICAL DATA:  Shoulder and injury.  Dislocation. EXAM: RIGHT SHOULDER - 2+ VIEW COMPARISON:  Radiograph 11/02/2019 FINDINGS: Anterior dislocation of the humeral head with respect to the glenoid. Cannot assess for L socks or bony Bankart on provided views. Acromioclavicular alignment is maintained. IMPRESSION: Anterior shoulder dislocation. Electronically Signed   By: Narda Rutherford M.D.   On: 05/30/2022 22:31    Pertinent labs & imaging results that were available during my care of the patient were reviewed by me and considered in my medical decision making (see MDM for details).  Medications Ordered in ED Medications  fentaNYL (SUBLIMAZE) injection 50 mcg (50 mcg Intravenous Given 05/30/22 2252)  propofol (DIPRIVAN) 10 mg/mL bolus/IV push (  Given 05/30/22 2307)  Procedures .Ortho Injury Treatment  Date/Time: 05/31/2022 10:47 AM  Performed by: Lonell Grandchild, MD Authorized by: Lonell Grandchild, MD   Consent:    Consent obtained:  Verbal   Consent given by:  Patient   Risks discussed:  Recurrent dislocation   Alternatives discussed:  No treatmentInjury location: shoulder Location details: right shoulder Injury type: dislocation Dislocation type: anterior Hill-Sachs deformity: yes Chronicity: recurrent Pre-procedure neurovascular assessment: neurovascularly intact Pre-procedure distal perfusion: normal Pre-procedure neurological function: normal Pre-procedure range of motion: normal  Anesthesia: Local anesthesia used: no  Patient sedated: Yes. Refer  to sedation procedure documentation for details of sedation. Manipulation performed: yes Reduction method: Milch technique Reduction successful: yes X-ray confirmed reduction: yes Immobilization: sling Splint Applied by: ED Nurse and ED Provider Post-procedure neurovascular assessment: post-procedure neurovascularly intact Post-procedure distal perfusion: normal Post-procedure neurological function: normal Post-procedure range of motion: normal   .Sedation  Date/Time: 05/31/2022 10:49 AM  Performed by: Lonell Grandchild, MD Authorized by: Lonell Grandchild, MD   Consent:    Consent obtained:  Written   Consent given by:  Patient   Risks discussed:  Allergic reaction, dysrhythmia, inadequate sedation, nausea, prolonged hypoxia resulting in organ damage, prolonged sedation necessitating reversal, respiratory compromise necessitating ventilatory assistance and intubation and vomiting   Alternatives discussed:  Analgesia without sedation and regional anesthesia Universal protocol:    Procedure explained and questions answered to patient or proxy's satisfaction: yes     Relevant documents present and verified: yes     Test results available: yes     Imaging studies available: yes     Required blood products, implants, devices, and special equipment available: yes     Site/side marked: yes     Immediately prior to procedure, a time out was called: yes     Patient identity confirmed:  Arm band, provided demographic data and verbally with patient Indications:    Procedure performed:  Dislocation reduction   Procedure necessitating sedation performed by:  Physician performing sedation Pre-sedation assessment:    Time since last food or drink:  6 hr   NPO status caution: urgency dictates proceeding with non-ideal NPO status     ASA classification: class 1 - normal, healthy patient     Mouth opening:  3 or more finger widths   Mallampati score:  I - soft palate, uvula, fauces, pillars  visible   Neck mobility: normal     Pre-sedation assessments completed and reviewed: airway patency, cardiovascular function, hydration status, mental status, nausea/vomiting, pain level, respiratory function and temperature     Pre-sedation assessment completed:  05/30/2022 11:05 PM Immediate pre-procedure details:    Reassessment: Patient reassessed immediately prior to procedure     Reviewed: vital signs, relevant labs/tests and NPO status     Verified: bag valve mask available, emergency equipment available, intubation equipment available, IV patency confirmed, oxygen available and suction available   Procedure details (see MAR for exact dosages):    Preoxygenation:  Nasal cannula   Sedation:  Propofol   Intended level of sedation: deep   Intra-procedure monitoring:  Cardiac monitor, blood pressure monitoring, continuous capnometry, continuous pulse oximetry, frequent LOC assessments and frequent vital sign checks   Intra-procedure events: none     Total Provider sedation time (minutes):  12 Post-procedure details:    Post-sedation assessment completed:  05/30/2022 11:45 PM   Attendance: Constant attendance by certified staff until patient recovered     Recovery: Patient returned to pre-procedure baseline  Post-sedation assessments completed and reviewed: airway patency, cardiovascular function, hydration status, mental status, nausea/vomiting, pain level, respiratory function and temperature     Patient is stable for discharge or admission: yes     Procedure completion:  Tolerated well, no immediate complications .1-3 Lead EKG Interpretation  Performed by: Lonell Grandchild, MD Authorized by: Lonell Grandchild, MD     Interpretation: normal     ECG rate:  75   ECG rate assessment: normal     Rhythm: sinus rhythm     Ectopy: none     Conduction: normal     (including critical care time)  Medical Decision Making / ED Course   MDM:  26 year old male presenting to the  emergency department with right shoulder pain.  X-ray demonstrates right shoulder dislocation.  Performed a sedation and dislocation reduction.  Patient tolerated procedure well.  See procedure note for details.  Postreduction neurovascular status intact with no sign of axillary neuropathy.  Patient reports significantly improved symptomatology.  Placed in sling and instructed to follow-up with on-call orthopedic physician or patient's prior orthopedic physician in approximately 1 week.  Hill-Sachs deformity noted, likely old given history of recurrent dislocation but discussed with patient. Will discharge patient to home. All questions answered. Patient comfortable with plan of discharge. Return precautions discussed with patient and specified on the after visit summary.       Additional history obtained: -Additional history obtained from spouse -External records from outside source obtained and reviewed including: Chart review including previous notes, labs, imaging, consultation notes   Lab Tests: -I ordered, reviewed, and interpreted labs.   The pertinent results include:   Labs Reviewed - No data to display    EKG   EKG Interpretation  Date/Time:    Ventricular Rate:    PR Interval:    QRS Duration:   QT Interval:    QTC Calculation:   R Axis:     Text Interpretation:           Imaging Studies ordered: I ordered imaging studies including XR shoulder On my interpretation imaging demonstrates dislocation and improved alignment after dislocation with hill-sachs deformity I independently visualized and interpreted imaging. I agree with the radiologist interpretation   Medicines ordered and prescription drug management: Meds ordered this encounter  Medications   DISCONTD: propofol (DIPRIVAN) 10 mg/mL bolus/IV push 83.9 mg   fentaNYL (SUBLIMAZE) injection 50 mcg   DISCONTD: propofol (DIPRIVAN) 500 MG/50ML infusion    [place], Jacob T: cabinet override   propofol  (DIPRIVAN) 10 mg/mL bolus/IV push    Clide Cliff T: cabinet override    -I have reviewed the patients home medicines and have made adjustments as needed     Cardiac Monitoring: The patient was maintained on a cardiac monitor.  I personally viewed and interpreted the cardiac monitored which showed an underlying rhythm of: NSR    Reevaluation: After the interventions noted above, I reevaluated the patient and found that they have resolved  Co morbidities that complicate the patient evaluation History reviewed. No pertinent past medical history.    Dispostion: Discharge    Final Clinical Impression(s) / ED Diagnoses Final diagnoses:  Anterior dislocation of right shoulder, initial encounter     This chart was dictated using voice recognition software.  Despite best efforts to proofread,  errors can occur which can change the documentation meaning.    Lonell Grandchild, MD 05/31/22 1055

## 2022-05-31 NOTE — ED Notes (Signed)
Pt able to eat and drink and ambulated to restroom with a steady gait
